# Patient Record
Sex: Female | Born: 1965
Health system: Southern US, Community
[De-identification: ages and names within clinical notes are randomized; demographics above are authoritative.]

## PROBLEM LIST (undated history)

## (undated) HISTORY — PX: BREAST EXCISIONAL BIOPSY: SUR124

## (undated) HISTORY — PX: BREAST SURGERY: SHX581

---

## 2007-02-05 ENCOUNTER — Emergency Department (HOSPITAL_COMMUNITY): Admission: EM | Admit: 2007-02-05 | Discharge: 2007-02-05 | Payer: Self-pay | Admitting: Family Medicine

## 2009-08-02 ENCOUNTER — Emergency Department (HOSPITAL_COMMUNITY): Admission: EM | Admit: 2009-08-02 | Discharge: 2009-08-02 | Payer: Self-pay | Admitting: Emergency Medicine

## 2010-01-28 ENCOUNTER — Ambulatory Visit (HOSPITAL_COMMUNITY): Admission: RE | Admit: 2010-01-28 | Discharge: 2010-01-28 | Payer: Self-pay | Admitting: Obstetrics

## 2010-06-04 ENCOUNTER — Emergency Department (HOSPITAL_COMMUNITY): Admission: EM | Admit: 2010-06-04 | Discharge: 2010-06-04 | Payer: Self-pay | Admitting: Emergency Medicine

## 2010-11-06 ENCOUNTER — Emergency Department (HOSPITAL_COMMUNITY)
Admission: EM | Admit: 2010-11-06 | Discharge: 2010-11-06 | Disposition: A | Discharge status: Home / Self Care | Payer: 59 | Attending: Emergency Medicine | Admitting: Emergency Medicine

## 2010-11-06 DIAGNOSIS — R220 Localized swelling, mass and lump, head: Secondary | ICD-10-CM | POA: Insufficient documentation

## 2010-11-06 DIAGNOSIS — IMO0002 Reserved for concepts with insufficient information to code with codable children: Secondary | ICD-10-CM | POA: Insufficient documentation

## 2010-12-20 ENCOUNTER — Other Ambulatory Visit: Payer: Self-pay | Admitting: Obstetrics

## 2010-12-20 DIAGNOSIS — Z1231 Encounter for screening mammogram for malignant neoplasm of breast: Secondary | ICD-10-CM

## 2011-01-30 ENCOUNTER — Ambulatory Visit (HOSPITAL_COMMUNITY)
Admission: RE | Admit: 2011-01-30 | Discharge: 2011-01-30 | Disposition: A | Discharge status: Home / Self Care | Payer: 59 | Source: Ambulatory Visit | Attending: Obstetrics | Admitting: Obstetrics

## 2011-01-30 DIAGNOSIS — Z1231 Encounter for screening mammogram for malignant neoplasm of breast: Secondary | ICD-10-CM | POA: Insufficient documentation

## 2011-04-10 IMAGING — CR DG FOOT COMPLETE 3+V*L*
3 series · 3 of 3 positions shown · non-contrast
Comparison: None

CLINICAL DATA: Left foot and ankle pain

LEFT FOOT - COMPLETE 3+ VIEW

[t foot ap left]
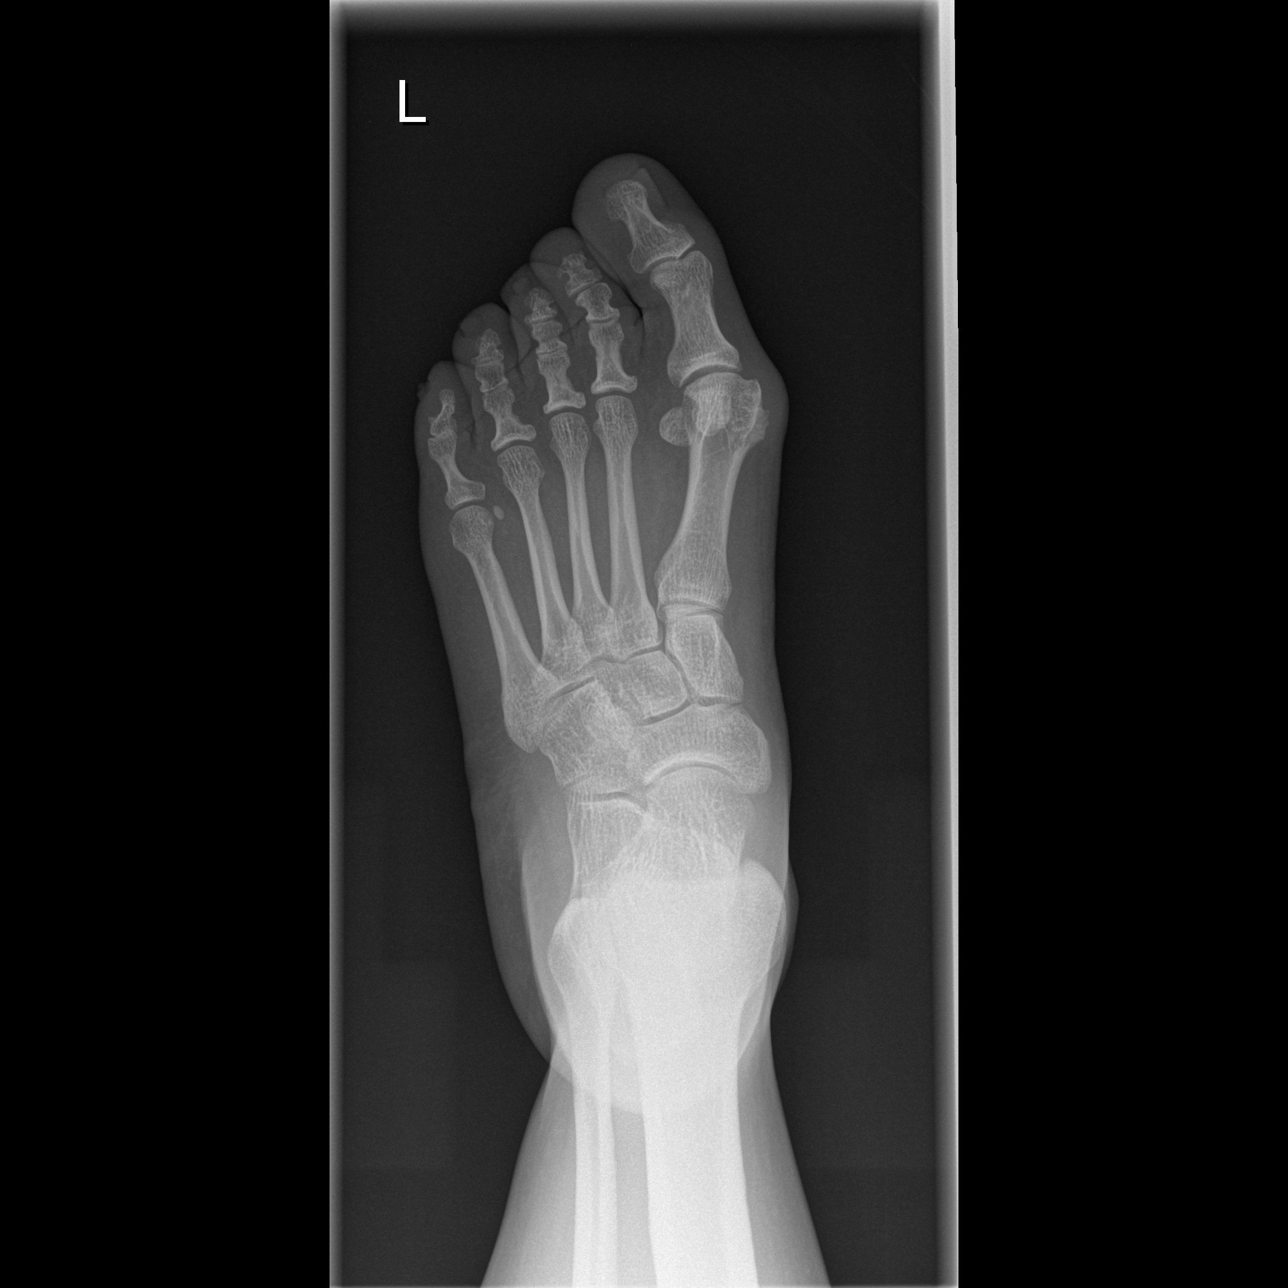

[t foot oblique left]
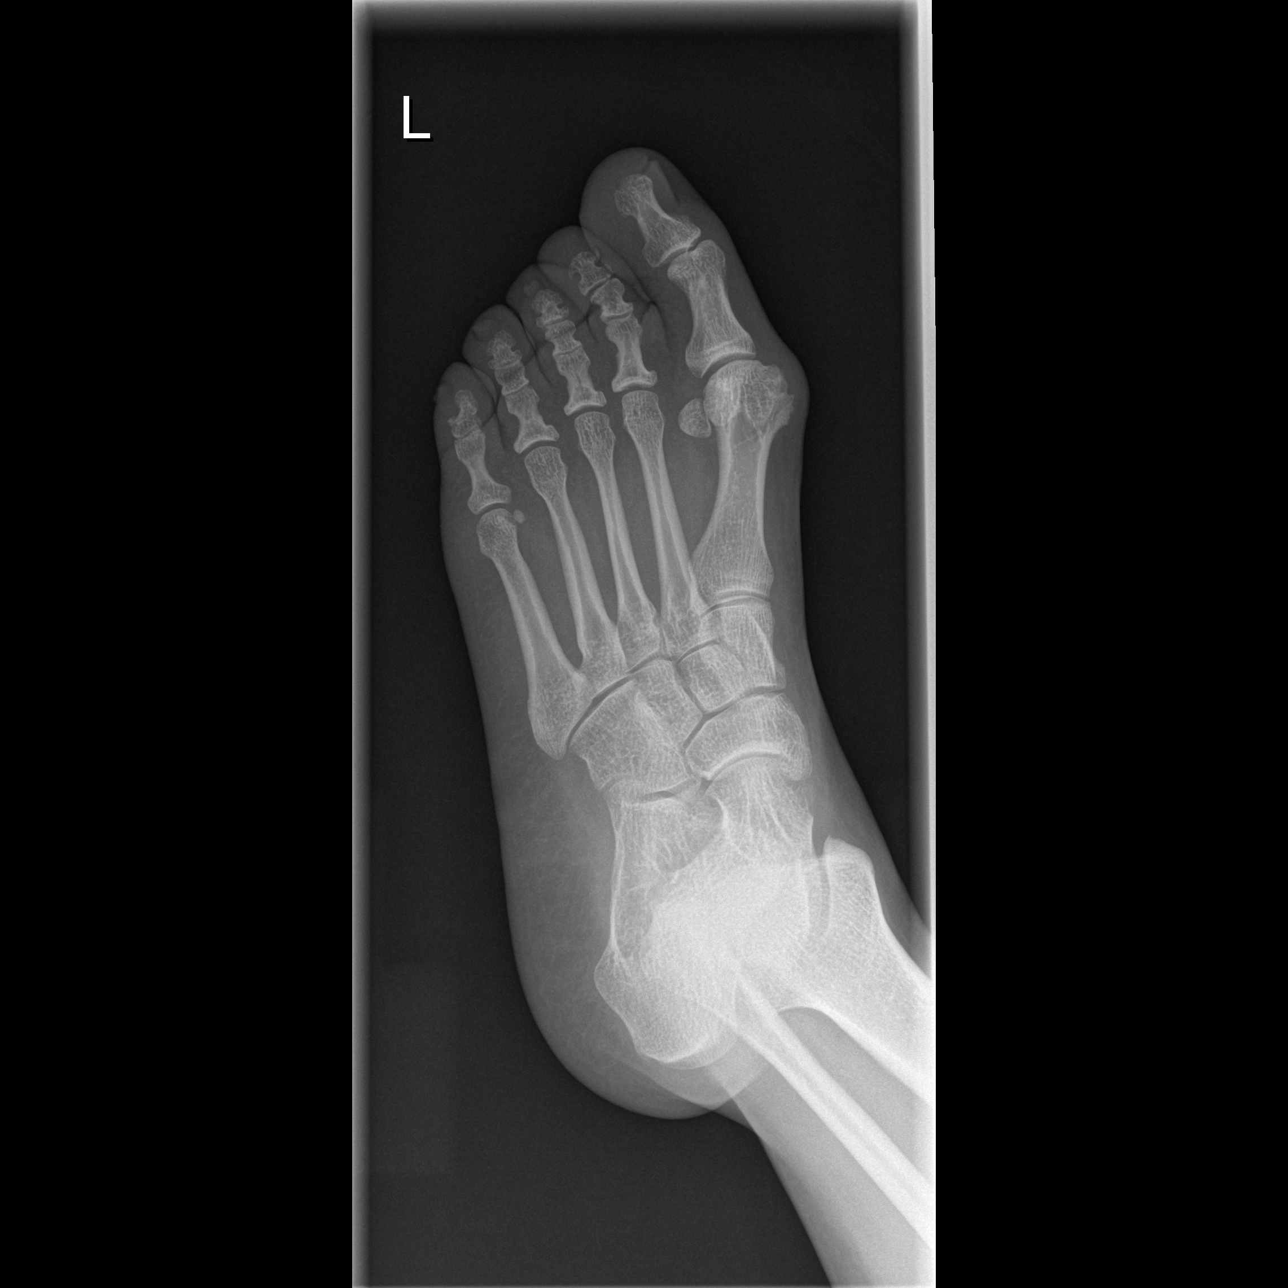

[t foot lat left]
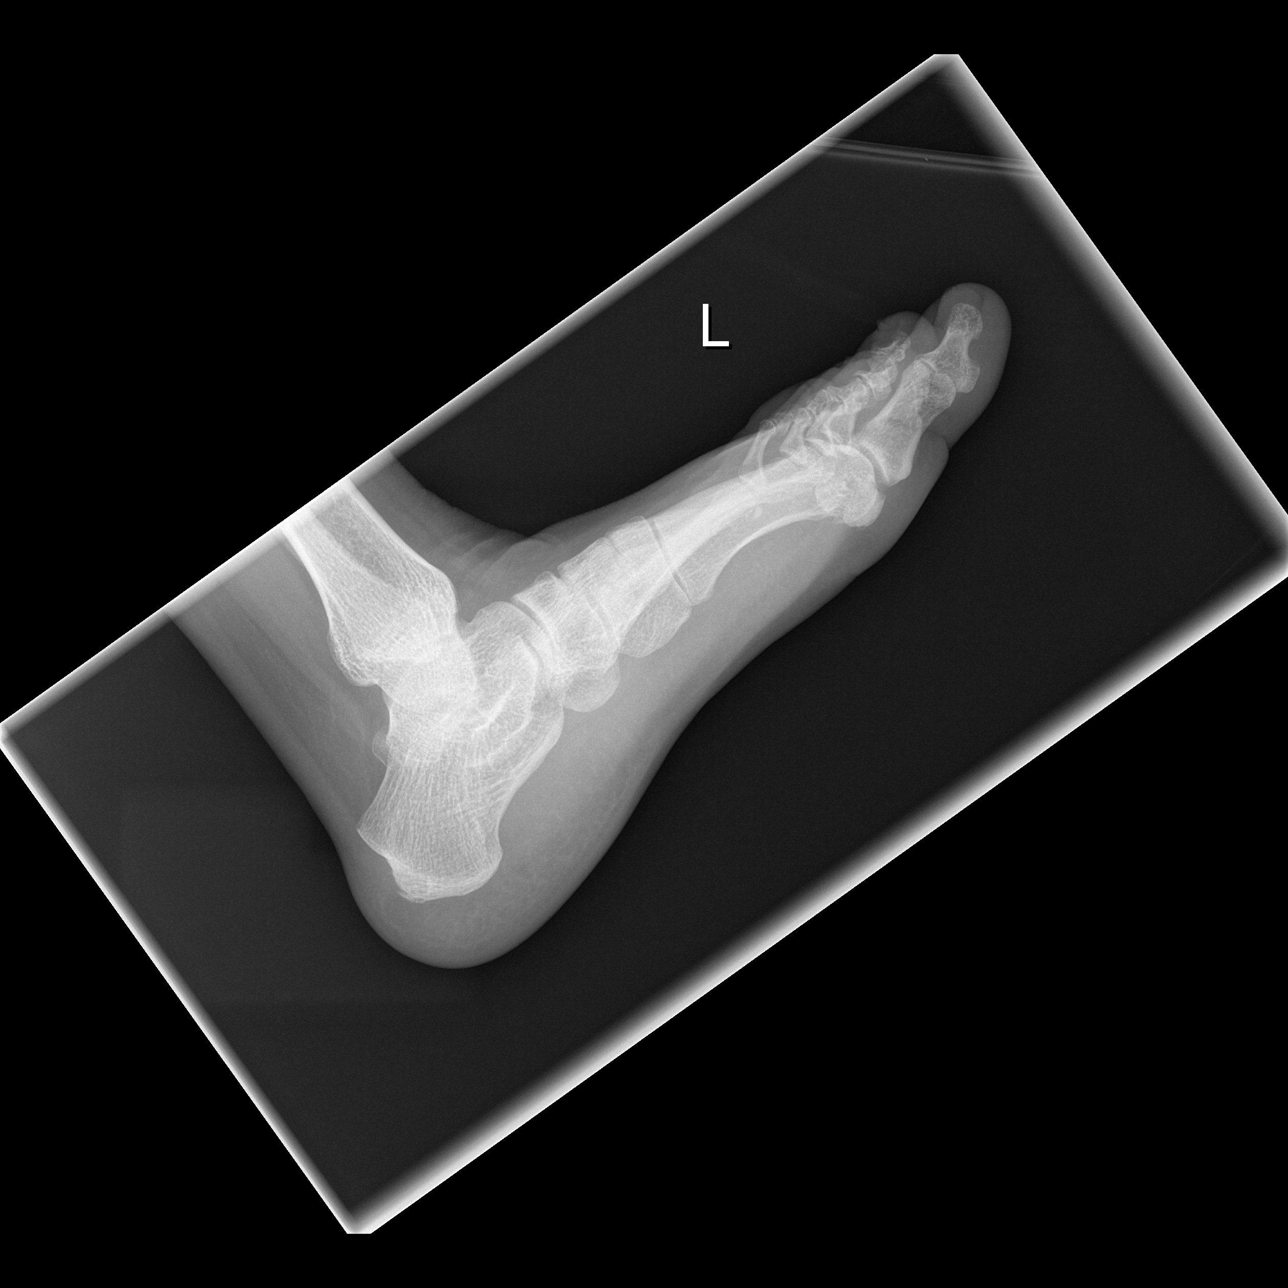

[3 of 3 positions shown; findings below may reference images not displayed]

FINDINGS: Mild hallux valgus.
Bone mineralization normal.
Joint spaces preserved.
No fracture, dislocation, or bone destruction.
Plantar arch appears relatively flat.
IMPRESSION: Hallux valgus.
No acute bony abnormalities.

## 2012-01-06 ENCOUNTER — Emergency Department (HOSPITAL_COMMUNITY)
Admission: EM | Admit: 2012-01-06 | Discharge: 2012-01-06 | Disposition: A | Discharge status: Home / Self Care | Payer: 59 | Attending: Emergency Medicine | Admitting: Emergency Medicine

## 2012-01-06 ENCOUNTER — Encounter (HOSPITAL_COMMUNITY): Payer: Self-pay | Admitting: Emergency Medicine

## 2012-01-06 DIAGNOSIS — J309 Allergic rhinitis, unspecified: Secondary | ICD-10-CM | POA: Insufficient documentation

## 2012-01-06 DIAGNOSIS — R1110 Vomiting, unspecified: Secondary | ICD-10-CM | POA: Insufficient documentation

## 2012-01-06 DIAGNOSIS — R197 Diarrhea, unspecified: Secondary | ICD-10-CM | POA: Insufficient documentation

## 2012-01-06 MED ORDER — ONDANSETRON 8 MG PO TBDP: 8.0000 mg | ORAL_TABLET | Freq: Three times a day (TID) | ORAL | Status: AC | PRN

## 2012-01-06 MED ORDER — LORATADINE 10 MG PO TABS: 10.0000 mg | ORAL_TABLET | Freq: Every day | ORAL | Status: DC

## 2012-01-06 MED ORDER — PSEUDOEPHEDRINE HCL ER 120 MG PO TB12: 120.0000 mg | ORAL_TABLET | Freq: Two times a day (BID) | ORAL | Status: AC

## 2012-01-06 NOTE — ED Notes (Signed)
Pt reports vomiting episodes happen after coughing, congestion noted

## 2012-01-06 NOTE — ED Provider Notes (Signed)
History     CSN: [id number]  Arrival date & time 01/06/12  1724   First MD Initiated Contact with Patient 01/06/12 2006      Chief Complaint  Patient presents with  . Medication Reaction    (Consider location/radiation/quality/duration/timing/severity/associated sxs/prior treatment) HPI History provided by pt.   Pt presented to an urgent care 2 days ago with a one day history of watery eyes, rhinorrhea and cough productive of yellow sputum.  Was diagnosed w/ sinusitis and d/c'd home w/ augmentin.  Pt has been compliant w/ medication but has since developed vomiting and diarrhea.  She believes this may be a medication side effect.  URI sx have also been associated scratchy throat, sneezing and mild, intermittent frontal headache.  Denies fever, dyspnea and CP.  Has not had abd pain or  Hematemesis/hematochezia/melena.  Has not tried anything else for symptoms.   History reviewed. No pertinent past medical history.  Past Surgical History  Procedure Date  . Breast surgery     No family history on file.  History  Substance Use Topics  . Smoking status: Never Smoker   . Smokeless tobacco: Not on file  . Alcohol Use: Yes     occasional    OB History    Grav Para Term Preterm Abortions TAB SAB Ect Mult Living                  Review of Systems  All other systems reviewed and are negative.    Allergies  Review of patient's allergies indicates no known allergies.  Home Medications   Current Outpatient Rx  Name Route Sig Dispense Refill  . LORATADINE 10 MG PO TABS Oral Take 1 tablet (10 mg total) by mouth daily. 30 tablet 0  . ONDANSETRON 8 MG PO TBDP Oral Take 1 tablet (8 mg total) by mouth every 8 (eight) hours as needed for nausea. 20 tablet 0  . PSEUDOEPHEDRINE HCL ER 120 MG PO TB12 Oral Take 1 tablet (120 mg total) by mouth every 12 (twelve) hours. 20 tablet 0    BP 121/90  Pulse 93  Temp(Src) 99 F (37.2 C) (Oral)  Resp 18  Ht 4\' 9"  (1.448 m)  Wt 151 lb  (68.493 kg)  BMI 32.68 kg/m2  SpO2 98%  LMP 12/29/2011  Physical Exam  Nursing note and vitals reviewed. Constitutional: She is oriented to person, place, and time. She appears well-developed and well-nourished. No distress.  HENT:  Head: No trismus in the jaw.  Right Ear: Tympanic membrane, external ear and ear canal normal.  Left Ear: Tympanic membrane, external ear and ear canal normal.  Mouth/Throat: Uvula is midline and mucous membranes are normal. No oropharyngeal exudate, posterior oropharyngeal edema or posterior oropharyngeal erythema.       No sinus tenderness  Eyes:       Normal appearance  Neck: Normal range of motion. Neck supple.  Cardiovascular: Normal rate and regular rhythm.   Pulmonary/Chest: Effort normal and breath sounds normal.       No coughing  Lymphadenopathy:    She has no cervical adenopathy.  Neurological: She is alert and oriented to person, place, and time.  Skin: Skin is warm and dry. No rash noted.  Psychiatric: She has a normal mood and affect. Her behavior is normal.    ED Course  Procedures (including critical care time)  Labs Reviewed - No data to display No results found.   1. Allergic rhinitis   2. Vomiting and diarrhea  MDM  Pt treated w/ augmentin by an urgent care for possible sinusitis 2 days ago.  This medication likely unnecessary based on history and exam and pt now experiencing vomiting and diarrhea that may or may not be a side effect.  Pt has a viral URI vs. Allergic rhinitis.  Recommended that she d/c the augmentin (has not been tolerating it anyways) and d/c'd home w/ claritin, sudafed, zofran.  Referred to [facility].        Otilio Miu, [place] 01/07/12 0110

## 2012-01-06 NOTE — ED Notes (Signed)
Pt states she was seen at Urgent Care Thursday for cold s/s. Given Augmentin, pt states vomiting after taking medication initially. Now nauseated at all times.

## 2012-01-06 NOTE — Discharge Instructions (Signed)
Discontinue the augmentin because this medication may be causing your vomiting and diarrhea and is likely not necessary anyways.  Take 2 claritin for the next 2-3 days and then one a day to treat allergy symptoms.  You can try sudafed for nasal congestion and runny nose.  You can try benadryl as well.  Take zofran as needed for nausea and imodium for diarrhea.  Drink plenty of fluids to avoid dehydration.  Call [facility] [phone]) if you do not have a primary care doctor and would like assistance with finding one.   You should return to the ER if your symptoms worsen or you develop shortness of breath. Allergic Rhinitis Allergic rhinitis is when the mucous membranes in the nose respond to allergens. Allergens are particles in the air that cause your body to have an allergic reaction. This causes you to release allergic antibodies. Through a chain of events, these eventually cause you to release histamine into the blood stream (hence the use of antihistamines). Although meant to be protective to the body, it is this release that causes your discomfort, such as frequent sneezing, congestion and an itchy runny nose.  CAUSES  The pollen allergens may come from grasses, trees, and weeds. This is seasonal allergic rhinitis, or "hay fever." Other allergens cause year-round allergic rhinitis (perennial allergic rhinitis) such as house dust mite allergen, pet dander and mold spores.  SYMPTOMS   Nasal stuffiness (congestion).   Runny, itchy nose with sneezing and tearing of the eyes.   There is often an itching of the mouth, eyes and ears.  It cannot be cured, but it can be controlled with medications. DIAGNOSIS  If you are unable to determine the offending allergen, skin or blood testing may find it. TREATMENT   Avoid the allergen.   Medications and allergy shots (immunotherapy) can help.   Hay fever may often be treated with antihistamines in pill or nasal spray forms. Antihistamines block the  effects of histamine. There are over-the-counter medicines that may help with nasal congestion and swelling around the eyes. Check with your caregiver before taking or giving this medicine.  If the treatment above does not work, there are many new medications your caregiver can prescribe. Stronger medications may be used if initial measures are ineffective. Desensitizing injections can be used if medications and avoidance fails. Desensitization is when a patient is given ongoing shots until the body becomes less sensitive to the allergen. Make sure you follow up with your caregiver if problems continue. SEEK MEDICAL CARE IF:   You develop fever (more than 100.5 F (38.1 C).   You develop a cough that does not stop easily (persistent).   You have shortness of breath.   You start wheezing.   Symptoms interfere with normal daily activities.  Document Released: 06/06/2001 Document Revised: 08/31/2011 Document Reviewed: 12/16/2008 [facility] Patient Information 2012 [place], [place].

## 2012-01-07 NOTE — ED Provider Notes (Signed)
Medical screening examination/treatment/procedure(s) were performed by non-physician practitioner and as supervising physician I was immediately available for consultation/collaboration.   Devantae Babe E Chastidy Ranker, MD 01/07/12 1548 

## 2012-02-12 ENCOUNTER — Other Ambulatory Visit: Payer: Self-pay | Admitting: Obstetrics

## 2012-02-12 DIAGNOSIS — Z1231 Encounter for screening mammogram for malignant neoplasm of breast: Secondary | ICD-10-CM

## 2012-03-07 ENCOUNTER — Ambulatory Visit (HOSPITAL_COMMUNITY): Payer: 59

## 2012-04-01 ENCOUNTER — Ambulatory Visit (HOSPITAL_COMMUNITY)
Admission: RE | Admit: 2012-04-01 | Discharge: 2012-04-01 | Disposition: A | Discharge status: Home / Self Care | Payer: 59 | Source: Ambulatory Visit | Attending: Obstetrics | Admitting: Obstetrics

## 2012-04-01 DIAGNOSIS — Z1231 Encounter for screening mammogram for malignant neoplasm of breast: Secondary | ICD-10-CM

## 2013-05-21 ENCOUNTER — Other Ambulatory Visit: Payer: Self-pay | Admitting: Obstetrics

## 2013-05-21 DIAGNOSIS — Z1231 Encounter for screening mammogram for malignant neoplasm of breast: Secondary | ICD-10-CM

## 2013-05-27 ENCOUNTER — Ambulatory Visit (HOSPITAL_COMMUNITY)
Admission: RE | Admit: 2013-05-27 | Discharge: 2013-05-27 | Disposition: A | Discharge status: Home / Self Care | Payer: 59 | Source: Ambulatory Visit | Attending: Obstetrics | Admitting: Obstetrics

## 2013-05-27 DIAGNOSIS — Z1231 Encounter for screening mammogram for malignant neoplasm of breast: Secondary | ICD-10-CM | POA: Insufficient documentation

## 2014-06-30 ENCOUNTER — Other Ambulatory Visit: Payer: Self-pay | Admitting: Obstetrics

## 2014-06-30 DIAGNOSIS — Z1231 Encounter for screening mammogram for malignant neoplasm of breast: Secondary | ICD-10-CM

## 2014-07-08 ENCOUNTER — Ambulatory Visit (HOSPITAL_COMMUNITY): Payer: 59

## 2014-07-16 ENCOUNTER — Ambulatory Visit (HOSPITAL_COMMUNITY)
Admission: RE | Admit: 2014-07-16 | Discharge: 2014-07-16 | Disposition: A | Discharge status: Home / Self Care | Payer: 59 | Source: Ambulatory Visit | Attending: Obstetrics | Admitting: Obstetrics

## 2014-07-16 DIAGNOSIS — Z1231 Encounter for screening mammogram for malignant neoplasm of breast: Secondary | ICD-10-CM | POA: Insufficient documentation

## 2016-05-25 ENCOUNTER — Other Ambulatory Visit: Payer: Self-pay | Admitting: Obstetrics and Gynecology

## 2016-05-25 DIAGNOSIS — Z1231 Encounter for screening mammogram for malignant neoplasm of breast: Secondary | ICD-10-CM

## 2016-06-06 ENCOUNTER — Ambulatory Visit
Admission: RE | Admit: 2016-06-06 | Discharge: 2016-06-06 | Disposition: A | Discharge status: Home / Self Care | Payer: Self-pay | Source: Ambulatory Visit | Attending: Obstetrics and Gynecology | Admitting: Obstetrics and Gynecology

## 2016-06-06 DIAGNOSIS — Z1231 Encounter for screening mammogram for malignant neoplasm of breast: Secondary | ICD-10-CM | POA: Diagnosis not present

## 2017-04-16 DIAGNOSIS — Z6839 Body mass index (BMI) 39.0-39.9, adult: Secondary | ICD-10-CM | POA: Diagnosis not present

## 2017-04-16 DIAGNOSIS — Z01419 Encounter for gynecological examination (general) (routine) without abnormal findings: Secondary | ICD-10-CM | POA: Diagnosis not present

## 2017-04-16 DIAGNOSIS — Z124 Encounter for screening for malignant neoplasm of cervix: Secondary | ICD-10-CM | POA: Diagnosis not present

## 2017-04-16 DIAGNOSIS — Z113 Encounter for screening for infections with a predominantly sexual mode of transmission: Secondary | ICD-10-CM | POA: Diagnosis not present

## 2017-04-16 DIAGNOSIS — Z3040 Encounter for surveillance of contraceptives, unspecified: Secondary | ICD-10-CM | POA: Diagnosis not present

## 2017-06-21 ENCOUNTER — Other Ambulatory Visit: Payer: Self-pay | Admitting: Obstetrics and Gynecology

## 2017-06-21 DIAGNOSIS — Z1231 Encounter for screening mammogram for malignant neoplasm of breast: Secondary | ICD-10-CM

## 2017-06-22 ENCOUNTER — Ambulatory Visit
Admission: RE | Admit: 2017-06-22 | Discharge: 2017-06-22 | Disposition: A | Discharge status: Home / Self Care | Payer: 59 | Source: Ambulatory Visit | Attending: Obstetrics and Gynecology | Admitting: Obstetrics and Gynecology

## 2017-06-22 DIAGNOSIS — Z1231 Encounter for screening mammogram for malignant neoplasm of breast: Secondary | ICD-10-CM

## 2017-12-25 DIAGNOSIS — M21611 Bunion of right foot: Secondary | ICD-10-CM | POA: Diagnosis not present

## 2017-12-25 DIAGNOSIS — M7741 Metatarsalgia, right foot: Secondary | ICD-10-CM | POA: Diagnosis not present

## 2017-12-25 DIAGNOSIS — R030 Elevated blood-pressure reading, without diagnosis of hypertension: Secondary | ICD-10-CM | POA: Diagnosis not present

## 2017-12-25 DIAGNOSIS — B351 Tinea unguium: Secondary | ICD-10-CM | POA: Diagnosis not present

## 2017-12-25 DIAGNOSIS — Z6832 Body mass index (BMI) 32.0-32.9, adult: Secondary | ICD-10-CM | POA: Diagnosis not present

## 2018-03-12 DIAGNOSIS — Z0000 Encounter for general adult medical examination without abnormal findings: Secondary | ICD-10-CM | POA: Diagnosis not present

## 2018-04-01 DIAGNOSIS — Z0000 Encounter for general adult medical examination without abnormal findings: Secondary | ICD-10-CM | POA: Diagnosis not present

## 2018-04-01 DIAGNOSIS — Z1211 Encounter for screening for malignant neoplasm of colon: Secondary | ICD-10-CM | POA: Diagnosis not present

## 2018-04-01 DIAGNOSIS — Z23 Encounter for immunization: Secondary | ICD-10-CM | POA: Diagnosis not present

## 2018-04-01 DIAGNOSIS — Z6832 Body mass index (BMI) 32.0-32.9, adult: Secondary | ICD-10-CM | POA: Diagnosis not present

## 2018-04-03 ENCOUNTER — Encounter: Payer: Self-pay | Admitting: Gastroenterology

## 2018-04-24 ENCOUNTER — Ambulatory Visit (AMBULATORY_SURGERY_CENTER): Payer: Self-pay | Admitting: *Deleted

## 2018-04-24 VITALS — Ht <= 58 in | Wt 163.0 lb

## 2018-04-24 DIAGNOSIS — Z1211 Encounter for screening for malignant neoplasm of colon: Secondary | ICD-10-CM

## 2018-04-24 MED ORDER — NA SULFATE-K SULFATE-MG SULF 17.5-3.13-1.6 GM/177ML PO SOLN: 1.0000 | Freq: Once | ORAL | 0 refills | Status: AC

## 2018-04-24 NOTE — Progress Notes (Signed)
Denies allergies to eggs or soy products. Denies complications with sedation or anesthesia. Denies O2 use. Denies use of diet or weight loss medications.  Emmi instructions not given for colonoscopy, no access to Internet.  

## 2018-05-07 ENCOUNTER — Other Ambulatory Visit (INDEPENDENT_AMBULATORY_CARE_PROVIDER_SITE_OTHER): Payer: 59

## 2018-05-07 ENCOUNTER — Ambulatory Visit (AMBULATORY_SURGERY_CENTER): Payer: 59 | Admitting: Gastroenterology

## 2018-05-07 ENCOUNTER — Encounter: Payer: Self-pay | Admitting: Gastroenterology

## 2018-05-07 VITALS — BP 150/107 | HR 65 | Temp 98.0°F | Resp 19 | Ht 59.0 in | Wt 163.0 lb

## 2018-05-07 DIAGNOSIS — Z1211 Encounter for screening for malignant neoplasm of colon: Secondary | ICD-10-CM | POA: Diagnosis not present

## 2018-05-07 DIAGNOSIS — D123 Benign neoplasm of transverse colon: Secondary | ICD-10-CM | POA: Diagnosis not present

## 2018-05-07 LAB — HCG, QUANTITATIVE, PREGNANCY: Quantitative HCG: 0.66 m[IU]/mL

## 2018-05-07 MED ORDER — SODIUM CHLORIDE 0.9 % IV SOLN: 500.0000 mL | Freq: Once | INTRAVENOUS | Status: DC

## 2018-05-07 NOTE — Op Note (Signed)
[place] Patient Name: Melissa Norman Procedure Date: 05/07/2018 2:58 PM MRN: [id number] Endoscopist: Gerrit Heck , MD Age: 52 Referring MD:  Date of Birth: December 20, 1965 Gender: Female Account #: [id number] Procedure:                Colonoscopy Indications:              Screening for colorectal malignant neoplasm, This                            is the patient's first colonoscopy. No family                            history of colon cancer and no active GI symptoms Medicines:                Monitored Anesthesia Care Procedure:                Pre-Anesthesia Assessment:                           - Prior to the procedure, a History and Physical                            was performed, and patient medications and                            allergies were reviewed. The patient's tolerance of                            previous anesthesia was also reviewed. The risks                            and benefits of the procedure and the sedation                            options and risks were discussed with the patient.                            All questions were answered, and informed consent                            was obtained. Prior Anticoagulants: The patient has                            taken no previous anticoagulant or antiplatelet                            agents. ASA Grade Assessment: II - A patient with                            mild systemic disease. After reviewing the risks                            and benefits, the patient was deemed in  satisfactory condition to undergo the procedure.                           After obtaining informed consent, the colonoscope                            was passed under direct vision. Throughout the                            procedure, the patient's blood pressure, pulse, and                            oxygen saturations were monitored continuously. The   Colonoscope was introduced through the anus and                            advanced to the the cecum, identified by                            appendiceal orifice and ileocecal valve. The                            colonoscopy was performed without difficulty. The                            patient tolerated the procedure well. The quality                            of the bowel preparation was good. Scope In: 3:02:58 PM Scope Out: 3:14:28 PM Scope Withdrawal Time: 0 hours 9 minutes 13 seconds  Total Procedure Duration: 0 hours 11 minutes 30 seconds  Findings:                 The perianal and digital rectal examinations were                            normal.                           A 5 mm polyp was found in the transverse colon. The                            polyp was sessile. The polyp was removed with a                            cold snare. Resection and retrieval were complete.                            Estimated blood loss was minimal.                           The exam was otherwise without abnormality.                           The retroflexed view of the distal rectum and  anal                            verge was normal and showed no anal or rectal                            abnormalities. Complications:            No immediate complications. Estimated Blood Loss:     Estimated blood loss was minimal. Impression:               - One 5 mm polyp in the transverse colon, removed                            with a cold snare. Resected and retrieved.                           - The examination was otherwise normal.                           - The distal rectum and anal verge are normal on                            retroflexion view. Recommendation:           - Patient has a contact number available for                            emergencies. The signs and symptoms of potential                            delayed complications were discussed with the                             patient. Return to normal activities tomorrow.                            Written discharge instructions were provided to the                            patient.                           - Resume previous diet today.                           - Continue present medications.                           - Await pathology results.                           - Repeat colonoscopy date to be determined after                            pending pathology results are reviewed.                           -  Return to GI clinic PRN. Gerrit Heck, MD 05/07/2018 3:22:13 PM

## 2018-05-07 NOTE — Patient Instructions (Signed)
Handout given on polyps  YOU HAD AN ENDOSCOPIC PROCEDURE TODAY AT THE [facility]:   Refer to the procedure report that was given to you for any specific questions about what was found during the examination.  If the procedure report does not answer your questions, please call your gastroenterologist to clarify.  If you requested that your care partner not be given the details of your procedure findings, then the procedure report has been included in a sealed envelope for you to review at your convenience later.  YOU SHOULD EXPECT: Some feelings of bloating in the abdomen. Passage of more gas than usual.  Walking can help get rid of the air that was put into your GI tract during the procedure and reduce the bloating. If you had a lower endoscopy (such as a colonoscopy or flexible sigmoidoscopy) you may notice spotting of blood in your stool or on the toilet paper. If you underwent a bowel prep for your procedure, you may not have a normal bowel movement for a few days.  Please Note:  You might notice some irritation and congestion in your nose or some drainage.  This is from the oxygen used during your procedure.  There is no need for concern and it should clear up in a day or so.  SYMPTOMS TO REPORT IMMEDIATELY:   Following lower endoscopy (colonoscopy or flexible sigmoidoscopy):  Excessive amounts of blood in the stool  Significant tenderness or worsening of abdominal pains  Swelling of the abdomen that is new, acute  Fever of 100F or higher    For urgent or emergent issues, a gastroenterologist can be reached at any hour by calling [phone].   DIET:  We do recommend a small meal at first, but then you may proceed to your regular diet.  Drink plenty of fluids but you should avoid alcoholic beverages for 24 hours.  ACTIVITY:  You should plan to take it easy for the rest of today and you should NOT DRIVE or use heavy machinery until tomorrow (because of the sedation  medicines used during the test).    FOLLOW UP: Our staff will call the number listed on your records the next business day following your procedure to check on you and address any questions or concerns that you may have regarding the information given to you following your procedure. If we do not reach you, we will leave a message.  However, if you are feeling well and you are not experiencing any problems, there is no need to return our call.  We will assume that you have returned to your regular daily activities without incident.  If any biopsies were taken you will be contacted by phone or by letter within the next 1-3 weeks.  Please call us at [phone] if you have not heard about the biopsies in 3 weeks.    SIGNATURES/CONFIDENTIALITY: You and/or your care partner have signed paperwork which will be entered into your electronic medical record.  These signatures attest to the fact that that the information above on your After Visit Summary has been reviewed and is understood.  Full responsibility of the confidentiality of this discharge information lies with you and/or your care-partner. 

## 2018-05-07 NOTE — Progress Notes (Signed)
Called to room to assist during endoscopic procedure.  Patient ID and intended procedure confirmed with present staff. Received instructions for my participation in the procedure from the performing physician.  

## 2018-05-07 NOTE — Progress Notes (Signed)
Spontaneous respirations throughout. VSS. Resting comfortably. To PACU on room air. Report to  RN. 

## 2018-05-07 NOTE — Progress Notes (Addendum)
Pt's states no medical or surgical changes since previsit or office visit.  Patient's pregnancy test was negative.  Dr aware.

## 2018-05-08 ENCOUNTER — Telehealth: Payer: Self-pay

## 2018-05-08 NOTE — Telephone Encounter (Signed)
  Follow up Call-  Call back number 05/07/2018  Post procedure Call Back phone  # [phone]  Permission to leave phone message Yes  Some recent data might be hidden     Patient questions:  Do you have a fever, pain , or abdominal swelling? No. Pain Score  0 *  Have you tolerated food without any problems? Yes.    Have you been able to return to your normal activities? Yes.    Do you have any questions about your discharge instructions: Diet   No. Medications  No. Follow up visit  No.  Do you have questions or concerns about your Care? No.  Actions: * If pain score is 4 or above: No action needed, pain <4.

## 2018-05-23 ENCOUNTER — Encounter: Payer: Self-pay | Admitting: Gastroenterology

## 2018-06-11 DIAGNOSIS — H47093 Other disorders of optic nerve, not elsewhere classified, bilateral: Secondary | ICD-10-CM | POA: Diagnosis not present

## 2018-06-11 DIAGNOSIS — H524 Presbyopia: Secondary | ICD-10-CM | POA: Diagnosis not present

## 2018-06-11 DIAGNOSIS — H52223 Regular astigmatism, bilateral: Secondary | ICD-10-CM | POA: Diagnosis not present

## 2018-06-11 DIAGNOSIS — H5213 Myopia, bilateral: Secondary | ICD-10-CM | POA: Diagnosis not present

## 2019-02-06 MED FILL — DICLOFENAC SODIUM 1 % GEL: 1 | 25 days supply | Qty: 200 | Fill #0

## 2021-12-06 ENCOUNTER — Emergency Department (HOSPITAL_COMMUNITY)
Admission: EM | Admit: 2021-12-06 | Discharge: 2021-12-06 | Disposition: A | Discharge status: Home / Self Care | Payer: 59 | Attending: Emergency Medicine | Admitting: Emergency Medicine

## 2021-12-06 ENCOUNTER — Encounter (HOSPITAL_COMMUNITY): Payer: Self-pay

## 2021-12-06 ENCOUNTER — Other Ambulatory Visit: Payer: Self-pay

## 2021-12-06 DIAGNOSIS — R059 Cough, unspecified: Secondary | ICD-10-CM | POA: Diagnosis present

## 2021-12-06 DIAGNOSIS — H6692 Otitis media, unspecified, left ear: Secondary | ICD-10-CM | POA: Diagnosis not present

## 2021-12-06 DIAGNOSIS — Z20822 Contact with and (suspected) exposure to covid-19: Secondary | ICD-10-CM | POA: Diagnosis not present

## 2021-12-06 DIAGNOSIS — J392 Other diseases of pharynx: Secondary | ICD-10-CM | POA: Insufficient documentation

## 2021-12-06 DIAGNOSIS — R0981 Nasal congestion: Secondary | ICD-10-CM | POA: Diagnosis not present

## 2021-12-06 DIAGNOSIS — H6690 Otitis media, unspecified, unspecified ear: Secondary | ICD-10-CM

## 2021-12-06 LAB — RESP PANEL BY RT-PCR (FLU A&B, COVID) ARPGX2
Influenza A by PCR: NEGATIVE
Influenza B by PCR: NEGATIVE
SARS Coronavirus 2 by RT PCR: NEGATIVE

## 2021-12-06 LAB — GROUP A STREP BY PCR: Group A Strep by PCR: NOT DETECTED

## 2021-12-06 MED ORDER — NAPROXEN 500 MG PO TABS: 500.0000 mg | ORAL_TABLET | Freq: Two times a day (BID) | ORAL | 0 refills | Status: AC | PRN

## 2021-12-06 MED ORDER — AMOXICILLIN 875 MG PO TABS: 875.0000 mg | ORAL_TABLET | Freq: Two times a day (BID) | ORAL | 0 refills | Status: AC

## 2021-12-06 MED ORDER — FLUTICASONE PROPIONATE 50 MCG/ACT NA SUSP: 1.0000 | Freq: Every day | NASAL | 0 refills | Status: AC

## 2021-12-06 NOTE — ED Triage Notes (Signed)
Pt reports with left ear pain and sore throat x 2 weeks.  ?

## 2021-12-06 NOTE — ED Provider Notes (Signed)
?[facility] DEPT ?Provider Note ? ? ?CSN: [id number] ?Arrival date & time: 12/06/21  0019 ? ?  ? ?History ? ?Chief Complaint  ?Patient presents with  ? Sore Throat  ? Otalgia  ? ? ?Melissa Norman is a 56 y.o. female without significant past medical history who presents to the emergency department with complaints of URI symptoms for the past 2 weeks.  Patient reports congestion/cough which ultimately turned into a sore throat and left ear pain.  Sore throat left ear pain are fairly constant, no alleviating or aggravating factors.  Denies fever, ear drainage, hearing loss, or shortness of breath. ? ?HPI ? ?  ? ?Home Medications ?Prior to Admission medications   ?Not on File  ?   ? ?Allergies    ?Patient has no known allergies.   ? ?Review of Systems   ?Review of Systems  ?Constitutional:  Negative for chills and fever.  ?HENT:  Positive for congestion, ear pain and sore throat.   ?Respiratory:  Positive for cough. Negative for shortness of breath.   ?Cardiovascular:  Negative for chest pain.  ?Gastrointestinal:  Negative for abdominal pain and vomiting.  ?Neurological:  Negative for syncope.  ?All other systems reviewed and are negative. ? ?Physical Exam ?Updated Vital Signs ?BP (!) 177/109 (BP Location: Left Arm)   Pulse 77   Temp 97.6 ?F (36.4 ?C) (Oral)   Resp 14   Ht '4\' 11"'$  (1.499 m)   Wt 68 kg   SpO2 97%   BMI 30.30 kg/m?  ?Physical Exam ?Vitals and nursing note reviewed.  ?Constitutional:   ?   General: She is not in acute distress. ?   Appearance: She is well-developed.  ?HENT:  ?   Head: Normocephalic and atraumatic.  ?   Right Ear: Ear canal normal. Tympanic membrane is not perforated, erythematous, retracted or bulging.  ?   Left Ear: Ear canal normal. Tympanic membrane is erythematous and bulging. Tympanic membrane is not perforated or retracted.  ?   Ears:  ?   Comments: Fullness to left TM.  ?No mastoid erythema/swelling/tenderness.  ?   Nose: Congestion present.   ?   Right Sinus: No maxillary sinus tenderness or frontal sinus tenderness.  ?   Left Sinus: No maxillary sinus tenderness or frontal sinus tenderness.  ?   Mouth/Throat:  ?   Pharynx: Uvula midline. Posterior oropharyngeal erythema present. No oropharyngeal exudate.  ?   Comments: Posterior oropharynx is symmetric appearing. Patient tolerating own secretions without difficulty. No trismus. No drooling. No hot potato voice. No swelling beneath the tongue, submandibular compartment is soft.  ?Eyes:  ?   General:     ?   Right eye: No discharge.     ?   Left eye: No discharge.  ?   Conjunctiva/sclera: Conjunctivae normal.  ?   Pupils: Pupils are equal, round, and reactive to light.  ?Cardiovascular:  ?   Rate and Rhythm: Normal rate and regular rhythm.  ?   Heart sounds: No murmur heard. ?Pulmonary:  ?   Effort: Pulmonary effort is normal. No respiratory distress.  ?   Breath sounds: Normal breath sounds. No wheezing, rhonchi or rales.  ?Abdominal:  ?   General: There is no distension.  ?   Palpations: Abdomen is soft.  ?   Tenderness: There is no abdominal tenderness.  ?Musculoskeletal:  ?   Cervical back: Normal range of motion and neck supple. No edema or rigidity.  ?Lymphadenopathy:  ?  Cervical: No cervical adenopathy.  ?Skin: ?   General: Skin is warm and dry.  ?   Findings: No rash.  ?Neurological:  ?   Mental Status: She is alert.  ?Psychiatric:     ?   Behavior: Behavior normal.  ? ? ?ED Results / Procedures / Treatments   ?Labs ?(all labs ordered are listed, but only abnormal results are displayed) ?Labs Reviewed  ?RESP PANEL BY RT-PCR (FLU A&B, COVID) ARPGX2  ?GROUP A STREP BY PCR  ? ? ?EKG ?None ? ?Radiology ?No results found. ? ?Procedures ?Procedures  ? ? ?Medications Ordered in ED ?Medications - No data to display ? ?ED Course/ Medical Decision Making/ A&P ?  ?                        ?Medical Decision Making ?Risk ?Prescription drug management. ? ? ?Patient presents to the emergency department with  complaints of URI symptoms for the past 2 weeks.  Nontoxic, blood pressure elevated, doubt hypertensive emergency but will need PCP recheck. ? ?Chart/nursing note reviewed for additional history. ? ?Strep test as well as COVID/flu test were ordered by triage team, have reviewed, they are negative. ? ?Left ear exam concerning for acute otitis media, given symptoms for 2 weeks now we will treat with antibiotics.  On exam there are no findings of AOE or mastoiditis.  There is no meningismus.  Strep test is negative.  Exam not consistent with RPA/PTA.  Lungs are clear to auscultation bilaterally, low suspicion for pneumonia.  Will treat with amoxicillin, will also provide Flonase and naproxen. I discussed results, treatment plan, need for follow-up, and return precautions with the patient. Provided opportunity for questions, patient confirmed understanding and is in agreement with plan.  ? ? ? ? ? ? ? ? ?Final Clinical Impression(s) / ED Diagnoses ?Final diagnoses:  ?Acute otitis media, unspecified otitis media type  ?Nasal congestion  ? ? ?Rx / DC Orders ?ED Discharge Orders   ? ?      Ordered  ?  fluticasone (FLONASE) 50 MCG/ACT nasal spray  Daily       ? 12/06/21 0448  ?  amoxicillin (AMOXIL) 875 MG tablet  2 times daily       ? 12/06/21 0448  ?  naproxen (NAPROSYN) 500 MG tablet  2 times daily PRN       ? 12/06/21 0448  ? ?  ?  ? ?  ? ? ?  ?Amaryllis Dyke, PA-C ?12/06/21 0542 ? ?  ?Delora Fuel, MD ?07/86/75 [phone] ? ?

## 2021-12-06 NOTE — Discharge Instructions (Addendum)
You are seen in the emergency department and found to have findings concerning for an ear infection.  Your strep test, COVID test, and flu test are negative.  We are sending home the following medications: ?- Amoxicillin: This is an antibiotic, take this twice per day for the next 1 week ?- Flonase: Use 1 spray per nostril daily to with nasal congestion ?- Naproxen:this is a nonsteroidal anti-inflammatory medication that will help with pain and swelling. Be sure to take this medication as prescribed with food, 1 pill every 12 hours,  It should be taken with food, as it can cause stomach upset, and more seriously, stomach bleeding. Do not take other nonsteroidal anti-inflammatory medications with this such as Advil, Motrin, Aleve, Mobic, Goodie Powder, or Motrin etc..   ? ?You make take Tylenol per over the counter dosing with these medications.  ? ?We have prescribed you new medication(s) today. Discuss the medications prescribed today with your pharmacist as they can have adverse effects and interactions with your other medicines including over the counter and prescribed medications. Seek medical evaluation if you start to experience new or abnormal symptoms after taking one of these medicines, seek care immediately if you start to experience difficulty breathing, feeling of your throat closing, facial swelling, or rash as these could be indications of a more serious allergic reaction ? ?Your blood pressure was elevated in the emergency department, please be sure to have this rechecked by primary care provider.  Please follow-up with primary care within a week for reevaluation.  Return to the ER for new or worsening symptoms including but not limited to new or worsening pain, pain/swelling/redness behind your ear, inability to swallow, trouble breathing, chest pain, passing out, neck stiffness, or any other concerns. ? ? ?

## 2024-01-30 ENCOUNTER — Ambulatory Visit: Admitting: Podiatry

## 2024-01-30 ENCOUNTER — Encounter: Payer: Self-pay | Admitting: Podiatry

## 2024-01-30 ENCOUNTER — Ambulatory Visit (INDEPENDENT_AMBULATORY_CARE_PROVIDER_SITE_OTHER)

## 2024-01-30 VITALS — Ht 59.0 in | Wt 150.0 lb

## 2024-01-30 DIAGNOSIS — M21611 Bunion of right foot: Secondary | ICD-10-CM

## 2024-01-30 DIAGNOSIS — M21619 Bunion of unspecified foot: Secondary | ICD-10-CM

## 2024-01-30 DIAGNOSIS — M2011 Hallux valgus (acquired), right foot: Secondary | ICD-10-CM | POA: Diagnosis not present

## 2024-01-30 NOTE — Progress Notes (Signed)
   Chief Complaint  Patient presents with   Bunions    Patient is here for right foot bunions burning and stinging    Subjective: 58 y.o. female presents today for evaluation of hallux valgus to the right foot ongoing for several years.  She states that currently the bunion is only moderately symptomatic.  Her main complaint today is a symptomatic callus between the 4th and 5th digit of the right foot.  She has been working at [employer] over the past year and wear steel toed boots.  Since that time she has noticed increased pain and tenderness secondary to a corn that develops between the 4th and 5th digits  History reviewed. No pertinent past medical history.  Past Surgical History:  Procedure Laterality Date   BREAST EXCISIONAL BIOPSY Left    BREAST SURGERY      No Known Allergies   Objective: Physical Exam General: The patient is alert and oriented x3 in no acute distress.  Dermatology: Skin is cool, dry and supple bilateral lower extremities. Negative for open lesions or macerations.  Hyperkeratotic callus noted at the base of the fourth digit between the adjacent fifth digit consistent with heloma molle.   Vascular: Palpable pedal pulses bilaterally. No edema or erythema noted. Capillary refill within normal limits.  Neurological: Grossly intact via light touch  Musculoskeletal Exam: Clinical evidence of bunion deformity noted to the respective foot. There is moderate pain on palpation range of motion of the first MPJ. Lateral deviation of the hallux noted consistent with hallux abductovalgus.  Radiographic Exam RT foot 01/30/2024: Normal osseous mineralization.  No acute fractures identified.  Hallux valgus deformity noted with increased intermetatarsal angle.  Pes planovalgus deformity also noted with medial deviation of the talar head  Assessment: 1.  Hallux valgus right 2.  Symptomatic callus fourth digit right foot 3.  Pes planovalgus right   Plan of Care:   -Patient was evaluated. X-Rays reviewed. -Excisional debridement of the callus lesion was performed today using a 312 scalpel without incident or bleeding.  Patient will significant relief after debridement -Recommend wide fitting shoes that do not constrict the toebox area and wide fitting steel toe shoes that she has to wear at work -In regards to the bunion deformity, today we discussed bunion surgery as well as the postoperative recovery course.  She would need to have at least 8 weeks out of work.  For now the patient will continue.  Conservative care including wide fitting supportive shoes -Return to clinic as needed   Dot Gazella, DPM [facility]  Dr. Dot Gazella, DPM    [address] [place], [place] [address]                Office [phone]  Fax [phone]

## 2024-03-02 ENCOUNTER — Encounter (HOSPITAL_COMMUNITY): Payer: Self-pay

## 2024-03-02 ENCOUNTER — Other Ambulatory Visit: Payer: Self-pay

## 2024-03-02 ENCOUNTER — Emergency Department (HOSPITAL_COMMUNITY)
Admission: EM | Admit: 2024-03-02 | Discharge: 2024-03-02 | Disposition: A | Discharge status: Home / Self Care | Attending: Emergency Medicine | Admitting: Emergency Medicine

## 2024-03-02 DIAGNOSIS — M79602 Pain in left arm: Secondary | ICD-10-CM | POA: Diagnosis not present

## 2024-03-02 DIAGNOSIS — W19XXXA Unspecified fall, initial encounter: Secondary | ICD-10-CM

## 2024-03-02 DIAGNOSIS — W01198A Fall on same level from slipping, tripping and stumbling with subsequent striking against other object, initial encounter: Secondary | ICD-10-CM | POA: Insufficient documentation

## 2024-03-02 DIAGNOSIS — S0990XA Unspecified injury of head, initial encounter: Secondary | ICD-10-CM | POA: Insufficient documentation

## 2024-03-02 NOTE — Discharge Instructions (Signed)
 It was a pleasure taking care of you today.  Today we evaluated you after your fall, evaluating your head as well as your left arm.  Based on history and physical exam we opted to not do imaging today. Please continue to monitor symptoms at home, if you begin to develop any of the following symptoms including but not limited to dizziness, weakness, visual disturbances, severe head pain, severe left arm pain, issues walking, please return to the emergency department or seek further medical care.  Please follow-up with your primary care provider and specialist as scheduled.  Please take over-the-counter Tylenol for pain.

## 2024-03-02 NOTE — ED Triage Notes (Signed)
 Pt arrives via POV. Pt states she was mopping and accidentally slipped and fell backwards. Pt struck her head on the floor. No loc, no blood thinners, pt is AxOx4. She does c/o pain to her left shoulder as well.

## 2024-03-02 NOTE — ED Provider Notes (Signed)
 [place] EMERGENCY DEPARTMENT AT [facility] Provider Note   CSN: [id number] Arrival date & time: 03/02/24  1648     History  Chief Complaint  Patient presents with   Head Injury   Arm Pain    Melissa Norman is a 58 y.o. female presents emergency department with a chief complaint of fall.  Patient states that she was mopping at home and excellently slipped and fell backwards.  Patient states that she struck her head on the floor.  Denies loss of consciousness denies blood thinners.  Patient also complaining of left shoulder pain.  Denies issues with left shoulder range of motion.  Denies numbness/tingling.  Denies neck pain.  Denies syncope, seizure, nausea, vomiting, issues with gait.  States that she does remember the fall.   Head Injury Associated symptoms: neck pain   Associated symptoms: no seizures   Arm Pain      Home Medications Prior to Admission medications   Medication Sig Start Date End Date Taking? Authorizing Provider  amoxicillin  (AMOXIL ) 875 MG tablet Take 1 tablet (875 mg total) by mouth 2 (two) times daily. 12/06/21   Petrucelli, Samantha R, PA-C  fluticasone  (FLONASE ) 50 MCG/ACT nasal spray Place 1 spray into both nostrils daily. 12/06/21   Petrucelli, Samantha R, PA-C  naproxen  (NAPROSYN ) 500 MG tablet Take 1 tablet (500 mg total) by mouth 2 (two) times daily as needed for moderate pain. 12/06/21   Petrucelli, Samantha R, PA-C      Allergies    Patient has no known allergies.    Review of Systems   Review of Systems  Musculoskeletal:  Positive for arthralgias and neck pain. Negative for neck stiffness.       L shoulder pain  Skin:        Swelling of occipital scalp  Neurological:  Negative for dizziness, seizures, syncope and weakness.  Psychiatric/Behavioral:  Negative for confusion.   All other systems reviewed and are negative.   Physical Exam Updated Vital Signs BP (!) 169/111   Pulse 86   Temp 98.5 F (36.9 C)   Resp 17    SpO2 98%  Physical Exam Vitals and nursing note reviewed.  Constitutional:      General: She is awake. She is not in acute distress.    Appearance: Normal appearance. She is normal weight. She is not ill-appearing, toxic-appearing or diaphoretic.  HENT:     Head:     Comments: Patient has significant swelling to occipital scalp, no open wounds or lacerations noted, swollen area tender to palpation Eyes:     General: No scleral icterus.    Extraocular Movements: Extraocular movements intact.     Pupils: Pupils are equal, round, and reactive to light.  Pulmonary:     Effort: Pulmonary effort is normal.  Musculoskeletal:        General: Tenderness (Subjective tenderness to palpation of L humerus) present. Normal range of motion.     Cervical back: Normal range of motion. No tenderness.     Comments: Normal ROM of L shoulder, elbow, wrist, fingers   No guarding present when palpating L humerus or remainder of L arm  Skin:    General: Skin is warm and dry.     Capillary Refill: Capillary refill takes less than 2 seconds.  Neurological:     General: No focal deficit present.     Mental Status: She is alert and oriented to person, place, and time.     Cranial Nerves: No  cranial nerve deficit.     Sensory: No sensory deficit.     Motor: No weakness.     Coordination: Coordination normal.     Gait: Gait normal.  Psychiatric:        Mood and Affect: Mood normal.        Behavior: Behavior normal. Behavior is cooperative.     ED Results / Procedures / Treatments   Labs (all labs ordered are listed, but only abnormal results are displayed) Labs Reviewed - No data to display  EKG None  Radiology No results found.  Procedures Procedures    Medications Ordered in ED Medications - No data to display  ED Course/ Medical Decision Making/ A&P    Patient presents to the ED for concern of fall, head pain, left arm pain, this involves an extensive number of treatment options,  and is a complaint that carries with it a high risk of complications and morbidity.  The differential diagnosis includes skull fracture, brain bleed, facial fracture, left arm fracture, etc.   Co morbidities that complicate the patient evaluation  None   Test Considered:  CT head, CT cervical spine, left shoulder/arm x-ray: Based on history, mechanism of injury, patient not taking blood thinner, physical exam, current neurologic status declined imaging at this time.  Patient clinically stable.  Referred to [place] head CT rules which in this case state CT is not necessary for this patient. Extensive return precautions given if symptoms change or worsen. Imaging offered today, but through shared decision making decided imaging was not necessary.  Patient and husband at bedside agree that if symptoms change or worsen they will return to the emergency department for further workup.   Critical Interventions:  none  Problem List / ED Course:  Fall, head injury, left arm pain Patient presents emergency department after slipping and falling while mopping at home.  Patient states that she fell directly backwards hitting the back of her head.  Denies blood thinner.  Patient does not have any red flag symptoms on history or physical exam, remembers the entire event, patient ambulatory without unsteady gait, patient denies dizziness, visual disturbances.  Patient denies neck pain, normal neck range of motion.  No cervical spine tenderness. Swelling present on occipital scalp, however clinical suspicion for skull fracture low. Referred to [place] head CT rules which indicate CT is not necessary for this patient Patient has been educated on these findings, normal range of motion of left shoulder, elbow, wrist, fingers.  Declined imaging at this time. Clinical suspicion for fracture very low. Extensive return precautions given if symptoms worsen or change Patient discharged, instructed to take  over-the-counter Tylenol for pain management.   Reevaluation:  After the interventions noted above, I reevaluated the patient and found that they have :stayed the same   Social Determinants of Health:  none   Dispostion:  After consideration of the diagnostic results and the patients response to treatment, I feel that the patent would benefit from discharge and outpatient analgesia with tylenol.  Patient instructed to follow-up with primary care and specialist as scheduled.  Return precautions given.  Click here for ABCD2, HEART and other calculatorsREFRESH Note before signing :1}                              Medical Decision Making          Final Clinical Impression(s) / ED Diagnoses Final diagnoses:  Fall, initial encounter  Injury of head, initial encounter  Left arm pain    Rx / DC Orders ED Discharge Orders     None         Fonda Hymen, PA-C 03/02/24 1815    Afton Horse T, DO 03/02/24 1834

## 2024-05-02 ENCOUNTER — Other Ambulatory Visit: Payer: Self-pay | Admitting: Nurse Practitioner

## 2024-05-02 DIAGNOSIS — Z1231 Encounter for screening mammogram for malignant neoplasm of breast: Secondary | ICD-10-CM

## 2024-05-07 ENCOUNTER — Ambulatory Visit
Admission: RE | Admit: 2024-05-07 | Discharge: 2024-05-07 | Disposition: A | Discharge status: Home / Self Care | Source: Ambulatory Visit | Attending: Nurse Practitioner | Admitting: Nurse Practitioner

## 2024-05-07 DIAGNOSIS — Z1231 Encounter for screening mammogram for malignant neoplasm of breast: Secondary | ICD-10-CM
